# Patient Record
Sex: Female | Born: 1985 | Race: White | Hispanic: No | Marital: Married | Smoking: Never smoker
Health system: Southern US, Community
[De-identification: ages and names within clinical notes are randomized; demographics above are authoritative.]

## PROBLEM LIST (undated history)

## (undated) DIAGNOSIS — Z789 Other specified health status: Secondary | ICD-10-CM

## (undated) HISTORY — PX: WISDOM TOOTH EXTRACTION: SHX21

---
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
## 2010-12-19 LAB — ABO/RH: RH Type: POSITIVE

## 2011-07-24 ENCOUNTER — Encounter (HOSPITAL_COMMUNITY): Payer: Self-pay | Admitting: *Deleted

## 2011-07-24 ENCOUNTER — Inpatient Hospital Stay (HOSPITAL_COMMUNITY)
Admission: AD | Admit: 2011-07-24 | Discharge: 2011-07-24 | Disposition: A | Discharge status: Home / Self Care | Payer: Self-pay | Source: Ambulatory Visit | Attending: Obstetrics and Gynecology | Admitting: Obstetrics and Gynecology

## 2011-07-24 ENCOUNTER — Inpatient Hospital Stay (HOSPITAL_COMMUNITY): Payer: Managed Care, Other (non HMO) | Admitting: Anesthesiology

## 2011-07-24 ENCOUNTER — Encounter (HOSPITAL_COMMUNITY): Payer: Self-pay | Admitting: Anesthesiology

## 2011-07-24 ENCOUNTER — Inpatient Hospital Stay (HOSPITAL_COMMUNITY)
Admission: AD | Admit: 2011-07-24 | Discharge: 2011-07-27 | DRG: 766 | Disposition: A | Discharge status: Home / Self Care | Payer: Managed Care, Other (non HMO) | Source: Ambulatory Visit | Attending: Obstetrics and Gynecology | Admitting: Obstetrics and Gynecology

## 2011-07-24 DIAGNOSIS — O324XX0 Maternal care for high head at term, not applicable or unspecified: Secondary | ICD-10-CM | POA: Diagnosis present

## 2011-07-24 DIAGNOSIS — O479 False labor, unspecified: Secondary | ICD-10-CM | POA: Insufficient documentation

## 2011-07-24 HISTORY — DX: Other specified health status: Z78.9

## 2011-07-24 LAB — CBC
HCT: 41.4 % (ref 36.0–46.0)
Hemoglobin: 14.5 g/dL (ref 12.0–15.0)
MCH: 30.5 pg (ref 26.0–34.0)
MCV: 87.2 fL (ref 78.0–100.0)
RBC: 4.75 MIL/uL (ref 3.87–5.11)

## 2011-07-24 LAB — POCT FERN TEST: Fern Test: POSITIVE

## 2011-07-24 MED ORDER — ONDANSETRON HCL 4 MG/2ML IJ SOLN: 4.0000 mg | Freq: Four times a day (QID) | INTRAMUSCULAR | Status: DC | PRN

## 2011-07-24 MED ORDER — OXYTOCIN BOLUS FROM INFUSION
500.0000 mL | Freq: Once | INTRAVENOUS | Status: DC
  Filled 2011-07-24: qty 500

## 2011-07-24 MED ORDER — FENTANYL 2.5 MCG/ML BUPIVACAINE 1/10 % EPIDURAL INFUSION (WH - ANES): 14.0000 mL/h | INTRAMUSCULAR | Status: DC

## 2011-07-24 MED ORDER — EPHEDRINE 5 MG/ML INJ: 10.0000 mg | INTRAVENOUS | Status: DC | PRN

## 2011-07-24 MED ORDER — LACTATED RINGERS IV SOLN
500.0000 mL | INTRAVENOUS | Status: DC | PRN
Start: 2011-07-24 — End: 2011-07-24

## 2011-07-24 MED ORDER — DIPHENHYDRAMINE HCL 50 MG/ML IJ SOLN: 12.5000 mg | INTRAMUSCULAR | Status: DC | PRN

## 2011-07-24 MED ORDER — LACTATED RINGERS IV SOLN: 500.0000 mL | Freq: Once | INTRAVENOUS | Status: DC

## 2011-07-24 MED ORDER — OXYTOCIN 20 UNITS IN LACTATED RINGERS INFUSION - SIMPLE: 125.0000 mL/h | Freq: Once | INTRAVENOUS | Status: DC

## 2011-07-24 MED ORDER — FLEET ENEMA 7-19 GM/118ML RE ENEM: 1.0000 | ENEMA | RECTAL | Status: DC | PRN

## 2011-07-24 MED ORDER — IBUPROFEN 600 MG PO TABS: 600.0000 mg | ORAL_TABLET | Freq: Four times a day (QID) | ORAL | Status: DC | PRN

## 2011-07-24 MED ORDER — LIDOCAINE HCL (PF) 1 % IJ SOLN: 30.0000 mL | INTRAMUSCULAR | Status: DC | PRN

## 2011-07-24 MED ORDER — EPHEDRINE 5 MG/ML INJ
10.0000 mg | INTRAVENOUS | Status: DC | PRN
  Filled 2011-07-24 (×2): qty 4

## 2011-07-24 MED ORDER — CITRIC ACID-SODIUM CITRATE 334-500 MG/5ML PO SOLN: 30.0000 mL | ORAL | Status: DC | PRN

## 2011-07-24 MED ORDER — FENTANYL 2.5 MCG/ML BUPIVACAINE 1/10 % EPIDURAL INFUSION (WH - ANES)
INTRAMUSCULAR | Status: DC | PRN
  Administered 2011-07-24: 14 mL/h via EPIDURAL

## 2011-07-24 MED ORDER — PHENYLEPHRINE 40 MCG/ML (10ML) SYRINGE FOR IV PUSH (FOR BLOOD PRESSURE SUPPORT)
80.0000 ug | PREFILLED_SYRINGE | INTRAVENOUS | Status: DC | PRN
Start: 2011-07-24 — End: 2011-07-27
  Filled 2011-07-24 (×2): qty 5

## 2011-07-24 MED ORDER — OXYCODONE-ACETAMINOPHEN 5-325 MG PO TABS: 2.0000 | ORAL_TABLET | ORAL | Status: DC | PRN

## 2011-07-24 MED ORDER — ACETAMINOPHEN 325 MG PO TABS: 650.0000 mg | ORAL_TABLET | ORAL | Status: DC | PRN

## 2011-07-24 MED ORDER — LACTATED RINGERS IV SOLN: INTRAVENOUS | Status: DC

## 2011-07-24 MED ORDER — LACTATED RINGERS IV SOLN
500.0000 mL | INTRAVENOUS | Status: DC | PRN
  Administered 2011-07-25: 300 mL via INTRAVENOUS

## 2011-07-24 MED ORDER — PHENYLEPHRINE 40 MCG/ML (10ML) SYRINGE FOR IV PUSH (FOR BLOOD PRESSURE SUPPORT): 80.0000 ug | PREFILLED_SYRINGE | INTRAVENOUS | Status: DC | PRN

## 2011-07-24 MED ORDER — LIDOCAINE HCL 1.5 % IJ SOLN
INTRAMUSCULAR | Status: DC | PRN
  Administered 2011-07-24 (×2): 5 mL via EPIDURAL

## 2011-07-24 MED ORDER — FENTANYL 2.5 MCG/ML BUPIVACAINE 1/10 % EPIDURAL INFUSION (WH - ANES)
14.0000 mL/h | INTRAMUSCULAR | Status: DC
  Administered 2011-07-24: 14 mL/h via EPIDURAL
  Filled 2011-07-24 (×2): qty 60

## 2011-07-24 MED ORDER — LACTATED RINGERS IV SOLN: 500.0000 mL | INTRAVENOUS | Status: DC | PRN

## 2011-07-24 MED ORDER — LACTATED RINGERS IV SOLN
INTRAVENOUS | Status: DC
  Administered 2011-07-24: 20:00:00 via INTRAVENOUS

## 2011-07-24 MED ORDER — PHENYLEPHRINE 40 MCG/ML (10ML) SYRINGE FOR IV PUSH (FOR BLOOD PRESSURE SUPPORT)
80.0000 ug | PREFILLED_SYRINGE | INTRAVENOUS | Status: DC | PRN
  Filled 2011-07-24: qty 5

## 2011-07-24 MED ORDER — TERBUTALINE SULFATE 1 MG/ML IJ SOLN: 0.2500 mg | Freq: Once | INTRAMUSCULAR | Status: AC | PRN

## 2011-07-24 MED ORDER — CITRIC ACID-SODIUM CITRATE 334-500 MG/5ML PO SOLN
30.0000 mL | ORAL | Status: DC | PRN
  Administered 2011-07-25: 30 mL via ORAL
  Filled 2011-07-24: qty 15

## 2011-07-24 MED ORDER — OXYTOCIN 20 UNITS IN LACTATED RINGERS INFUSION - SIMPLE
1.0000 m[IU]/min | INTRAVENOUS | Status: DC
  Filled 2011-07-24: qty 1000

## 2011-07-24 MED ORDER — LIDOCAINE HCL (PF) 1 % IJ SOLN
30.0000 mL | INTRAMUSCULAR | Status: DC | PRN
  Filled 2011-07-24 (×2): qty 30

## 2011-07-24 MED ORDER — LACTATED RINGERS IV SOLN
INTRAVENOUS | Status: DC
  Administered 2011-07-25 (×4): via INTRAVENOUS

## 2011-07-24 NOTE — Progress Notes (Signed)
Dr. Senaida Ores updated on patient status, MVU's, and UC pattern.

## 2011-07-24 NOTE — Progress Notes (Signed)
Dr. Jackelyn Knife notified of pt arrival for labor check.  Notified of reactive fetal strip with ctx every 3-5 min.  Notified of no cervical change after one hour.  Orders received to dc home.

## 2011-07-24 NOTE — Progress Notes (Signed)
Contractions q 5 minutes, denies bleeding or ROM

## 2011-07-24 NOTE — H&P (Signed)
Julie Joseph is a 25 y.o. female G1P0 at 40 weeks (EDD 07/28/11 by [place] at 9 weeks) presents with SROM at 6pm and strong ctx.  Prenatal care uneventful.  Maternal Medical History:  Reason for admission: Reason for admission: rupture of membranes.  Contractions: Onset was 1-2 hours ago.   Frequency: regular.   Perceived severity is strong.      OB History    Grav Para Term Preterm Abortions TAB SAB Ect Mult Living   1              Past Medical History  Diagnosis Date  . No pertinent past medical history    Past Surgical History  Procedure Date  . Wisdom tooth extraction    Social History: quit smoking 12/11, no drugs or ETOH ROS  negative  Dilation: 4 Effacement (%): 80 Station: 0 Exam by:: apannell,rn Blood pressure 118/75, pulse 98, temperature 97.6 F (36.4 C), temperature source Oral, resp. rate 20, height 5\' 5"  (1.651 m), weight 66.225 kg (146 lb), SpO2 100.00%. Maternal Exam:  Uterine Assessment: Contraction frequency is regular.   Abdomen: Patient reports no abdominal tenderness. Fetal presentation: vertex  Introitus: Ferning test: positive.  Nitrazine test: positive. Amniotic fluid character: clear.  Cervix: Cervix evaluated by digital exam.     Physical Exam  Constitutional: She appears well-developed.  Cardiovascular: Normal rate and regular rhythm.   Respiratory: Effort normal and breath sounds normal.  GI: Soft. Bowel sounds are normal.  Genitourinary:       Cervix 90/2-3/-1 per RN on admission  Psychiatric: She has a normal mood and affect.    Prenatal labs: ABO, Rh:  B positive Antibody:  negative Rubella:  Immune RPR:   NR HBsAg:   Neg HIV:   NR GBS:   Neg One hour GTT 89 CF neg GC neg Chlam neg Declined genetics   Assessment/Plan: Pt receiving epidural.  Will follow progress.   Julie Joseph 07/24/2011, 8:01 PM

## 2011-07-24 NOTE — Anesthesia Preprocedure Evaluation (Signed)
Anesthesia Evaluation  Name, MR# and DOB Patient awake  General Assessment Comment  Reviewed: Allergy & Precautions, H&P , NPO status , Patient's Chart, lab work & pertinent test results  Airway Mallampati: I TM Distance: >3 FB Neck ROM: full    Dental No notable dental hx.    Pulmonary  clear to auscultation  pulmonary exam normalPulmonary Exam Normal breath sounds clear to auscultation none    Cardiovascular     Neuro/Psych Negative Neurological ROS  Negative Psych ROS  GI/Hepatic/Renal negative GI ROS  negative Liver ROS  negative Renal ROS        Endo/Other  Negative Endocrine ROS (+)      Abdominal Normal abdominal exam  (+)   Musculoskeletal negative musculoskeletal ROS (+)   Hematology negative hematology ROS (+)   Peds  Reproductive/Obstetrics (+) Pregnancy    Anesthesia Other Findings             Anesthesia Physical Anesthesia Plan  ASA: II  Anesthesia Plan: Epidural   Post-op Pain Management:    Induction:   Airway Management Planned:   Additional Equipment:   Intra-op Plan:   Post-operative Plan:   Informed Consent: I have reviewed the patients History and Physical, chart, labs and discussed the procedure including the risks, benefits and alternatives for the proposed anesthesia with the patient or authorized representative who has indicated his/her understanding and acceptance.     Plan Discussed with:   Anesthesia Plan Comments:         Anesthesia Quick Evaluation

## 2011-07-24 NOTE — Anesthesia Procedure Notes (Signed)
Epidural Patient location during procedure: OB Start time: 07/24/2011 7:28 PM End time: 07/24/2011 7:36 PM Reason for block: procedure for pain  Staffing Anesthesiologist: Sandrea Hughs Performed by: anesthesiologist   Preanesthetic Checklist Completed: patient identified, site marked, surgical consent, pre-op evaluation, timeout performed, IV checked, risks and benefits discussed and monitors and equipment checked  Epidural Patient position: sitting Prep: site prepped and draped and DuraPrep Patient monitoring: continuous pulse ox and blood pressure Approach: midline Injection technique: LOR air  Needle:  Needle type: Tuohy  Needle gauge: 17 G Needle length: 9 cm Needle insertion depth: 5 cm cm Catheter type: closed end flexible Catheter size: 19 Gauge Catheter at skin depth: 10 cm Test dose: negative and 1.5% lidocaine  Assessment Sensory level: T8 Events: blood not aspirated, injection not painful, no injection resistance, negative IV test and no paresthesia

## 2011-07-24 NOTE — Progress Notes (Signed)
Attempted to retake vitals.  BP would not take.

## 2011-07-24 NOTE — Progress Notes (Signed)
Julie Joseph is a 25 y.o. G1P0 at [redacted]w[redacted]d   Subjective: Pt with some pressure  Objective: BP 122/78  Pulse 100  Temp(Src) 98.5 F (36.9 C) (Oral)  Resp 18  Ht 5\' 5"  (1.651 m)  Wt 66.225 kg (146 lb)  BMI 24.30 kg/m2  SpO2 100%      FHT:  FHR: 130 bpm, variability: moderate,  accelerations:  Present,  decelerations:  Absent UC:   regular, every 1-2 minutes SVE:   Dilation: 8.5 Effacement (%): 100 Station: 0 Exam by:: Senaida Ores, MD Feels OP, placed in exaggerated Sims  Labs: Lab Results  Component Value Date   WBC 11.8* 07/24/2011   HGB 14.5 07/24/2011   HCT 41.4 07/24/2011   MCV 87.2 07/24/2011   PLT 156 07/24/2011    Assessment / Plan: Spontaneous labor, progressing normally  Labor: pt had adequate MVU, did not require pitocin Fetal Wellbeing:  Category I Pain Control:  Epidural IRICHARDSON,Pierre Cumpton W 07/24/2011, 10:04 PM

## 2011-07-24 NOTE — Progress Notes (Signed)
Pt presents to mau for labor check.  Ctx started worsening around 10pm.  Has been 1cm on last Thursday.

## 2011-07-24 NOTE — Progress Notes (Signed)
Reviewed FHT from earlier this am.   Reactive NST/neg CST

## 2011-07-24 NOTE — Progress Notes (Signed)
Julie Joseph is a 25 y.o. G1P0 at [redacted]w[redacted]d   Subjective: Pt now comfortable with epidural  Objective: BP 110/80  Pulse 96  Temp(Src) 97.6 F (36.4 C) (Oral)  Resp 20  Ht 5\' 5"  (1.651 m)  Wt 66.225 kg (146 lb)  BMI 24.30 kg/m2  SpO2 100%      FHT:  FHR: 130 bpm, variability: moderate,  accelerations:  Present,  decelerations:  Absent UC:   regular, every 3 minutes SVE:   Dilation: 4.5 Effacement (%): 90 Station: 0 Exam by:: Senaida Ores, MD IUPC placed  Labs: Lab Results  Component Value Date   WBC 11.8* 07/24/2011   HGB 14.5 07/24/2011   HCT 41.4 07/24/2011   MCV 87.2 07/24/2011   PLT 156 07/24/2011    Assessment / Plan: Will augment with pitocin since slow cervical change   Caylei Sperry W 07/24/2011, 8:30 PM

## 2011-07-25 ENCOUNTER — Encounter (HOSPITAL_COMMUNITY)
Admission: AD | Disposition: A | Discharge status: Home / Self Care | Payer: Self-pay | Source: Ambulatory Visit | Attending: Obstetrics and Gynecology

## 2011-07-25 ENCOUNTER — Encounter (HOSPITAL_COMMUNITY): Payer: Self-pay

## 2011-07-25 SURGERY — Surgical Case
Anesthesia: Epidural | Wound class: Clean Contaminated

## 2011-07-25 MED ORDER — FENTANYL CITRATE 0.05 MG/ML IJ SOLN
INTRAMUSCULAR | Status: DC | PRN
  Administered 2011-07-25 (×5): 50 ug via INTRAVENOUS

## 2011-07-25 MED ORDER — MEPERIDINE HCL 25 MG/ML IJ SOLN
6.2500 mg | INTRAMUSCULAR | Status: DC | PRN
  Administered 2011-07-25: 12.5 mg via INTRAVENOUS

## 2011-07-25 MED ORDER — WITCH HAZEL-GLYCERIN EX PADS: 1.0000 "application " | MEDICATED_PAD | CUTANEOUS | Status: DC | PRN

## 2011-07-25 MED ORDER — KETOROLAC TROMETHAMINE 60 MG/2ML IM SOLN
60.0000 mg | Freq: Once | INTRAMUSCULAR | Status: AC | PRN
  Administered 2011-07-25: 60 mg via INTRAMUSCULAR

## 2011-07-25 MED ORDER — CEFAZOLIN SODIUM 1-5 GM-% IV SOLN
INTRAVENOUS | Status: AC
  Filled 2011-07-25: qty 50

## 2011-07-25 MED ORDER — MEPERIDINE HCL 25 MG/ML IJ SOLN
INTRAMUSCULAR | Status: AC
  Filled 2011-07-25: qty 1

## 2011-07-25 MED ORDER — DIPHENHYDRAMINE HCL 25 MG PO CAPS: 25.0000 mg | ORAL_CAPSULE | ORAL | Status: DC | PRN

## 2011-07-25 MED ORDER — MORPHINE SULFATE 0.5 MG/ML IJ SOLN
INTRAMUSCULAR | Status: AC
  Filled 2011-07-25: qty 10

## 2011-07-25 MED ORDER — DIPHENHYDRAMINE HCL 25 MG PO CAPS: 25.0000 mg | ORAL_CAPSULE | Freq: Four times a day (QID) | ORAL | Status: DC | PRN

## 2011-07-25 MED ORDER — IBUPROFEN 600 MG PO TABS
600.0000 mg | ORAL_TABLET | Freq: Four times a day (QID) | ORAL | Status: DC
  Administered 2011-07-25 – 2011-07-27 (×8): 600 mg via ORAL
  Filled 2011-07-25: qty 1

## 2011-07-25 MED ORDER — ZOLPIDEM TARTRATE 5 MG PO TABS: 5.0000 mg | ORAL_TABLET | Freq: Every evening | ORAL | Status: DC | PRN

## 2011-07-25 MED ORDER — HYDROMORPHONE HCL 1 MG/ML IJ SOLN: 0.2500 mg | INTRAMUSCULAR | Status: DC | PRN

## 2011-07-25 MED ORDER — SENNOSIDES-DOCUSATE SODIUM 8.6-50 MG PO TABS
2.0000 | ORAL_TABLET | Freq: Every day | ORAL | Status: DC
  Administered 2011-07-26: 2 via ORAL

## 2011-07-25 MED ORDER — MEPERIDINE HCL 25 MG/ML IJ SOLN
INTRAMUSCULAR | Status: DC | PRN
  Administered 2011-07-25: 6 mg via INTRAVENOUS
  Administered 2011-07-25: 7 mg via INTRAVENOUS
  Administered 2011-07-25 (×2): 6 mg via INTRAVENOUS

## 2011-07-25 MED ORDER — SODIUM CHLORIDE 0.9 % IJ SOLN: 3.0000 mL | INTRAMUSCULAR | Status: DC | PRN

## 2011-07-25 MED ORDER — SCOPOLAMINE 1 MG/3DAYS TD PT72
MEDICATED_PATCH | TRANSDERMAL | Status: AC
  Filled 2011-07-25: qty 1

## 2011-07-25 MED ORDER — OXYTOCIN 20 UNITS IN LACTATED RINGERS INFUSION - SIMPLE: 125.0000 mL/h | INTRAVENOUS | Status: AC

## 2011-07-25 MED ORDER — IBUPROFEN 600 MG PO TABS
600.0000 mg | ORAL_TABLET | Freq: Four times a day (QID) | ORAL | Status: DC | PRN
  Filled 2011-07-25 (×7): qty 1

## 2011-07-25 MED ORDER — PRENATAL PLUS 27-1 MG PO TABS
1.0000 | ORAL_TABLET | Freq: Every day | ORAL | Status: DC
  Administered 2011-07-25 – 2011-07-27 (×3): 1 via ORAL
  Filled 2011-07-25 (×2): qty 1

## 2011-07-25 MED ORDER — LACTATED RINGERS IV SOLN
INTRAVENOUS | Status: DC | PRN
  Administered 2011-07-25: 02:00:00 via INTRAVENOUS

## 2011-07-25 MED ORDER — TETANUS-DIPHTH-ACELL PERTUSSIS 5-2.5-18.5 LF-MCG/0.5 IM SUSP
0.5000 mL | Freq: Once | INTRAMUSCULAR | Status: AC
  Administered 2011-07-26: 0.5 mL via INTRAMUSCULAR
  Filled 2011-07-25: qty 0.5

## 2011-07-25 MED ORDER — OXYTOCIN 20 UNITS IN LACTATED RINGERS INFUSION - SIMPLE
INTRAVENOUS | Status: DC | PRN
  Administered 2011-07-25 (×2): 20 [IU] via INTRAVENOUS

## 2011-07-25 MED ORDER — SIMETHICONE 80 MG PO CHEW
80.0000 mg | CHEWABLE_TABLET | Freq: Three times a day (TID) | ORAL | Status: DC
  Administered 2011-07-25 – 2011-07-26 (×7): 80 mg via ORAL

## 2011-07-25 MED ORDER — FENTANYL CITRATE 0.05 MG/ML IJ SOLN
INTRAMUSCULAR | Status: AC
  Filled 2011-07-25: qty 5

## 2011-07-25 MED ORDER — ONDANSETRON HCL 4 MG/2ML IJ SOLN
INTRAMUSCULAR | Status: DC | PRN
  Administered 2011-07-25: 4 mg via INTRAVENOUS

## 2011-07-25 MED ORDER — LIDOCAINE-EPINEPHRINE 2 %-1:100000 IJ SOLN
INTRAMUSCULAR | Status: DC | PRN
  Administered 2011-07-25 (×4): 5 mL via INTRADERMAL

## 2011-07-25 MED ORDER — NALBUPHINE SYRINGE 5 MG/0.5 ML
5.0000 mg | INJECTION | INTRAMUSCULAR | Status: DC | PRN
  Filled 2011-07-25: qty 1

## 2011-07-25 MED ORDER — SIMETHICONE 80 MG PO CHEW: 80.0000 mg | CHEWABLE_TABLET | ORAL | Status: DC | PRN

## 2011-07-25 MED ORDER — LACTATED RINGERS IV SOLN
INTRAVENOUS | Status: DC
  Administered 2011-07-25: 13:00:00 via INTRAVENOUS

## 2011-07-25 MED ORDER — ONDANSETRON HCL 4 MG/2ML IJ SOLN
INTRAMUSCULAR | Status: AC
  Filled 2011-07-25: qty 2

## 2011-07-25 MED ORDER — KETOROLAC TROMETHAMINE 30 MG/ML IJ SOLN: 15.0000 mg | Freq: Once | INTRAMUSCULAR | Status: DC | PRN

## 2011-07-25 MED ORDER — ONDANSETRON HCL 4 MG PO TABS: 4.0000 mg | ORAL_TABLET | ORAL | Status: DC | PRN

## 2011-07-25 MED ORDER — KETOROLAC TROMETHAMINE 60 MG/2ML IM SOLN
INTRAMUSCULAR | Status: AC
  Filled 2011-07-25: qty 2

## 2011-07-25 MED ORDER — OXYCODONE-ACETAMINOPHEN 5-325 MG PO TABS: 1.0000 | ORAL_TABLET | ORAL | Status: DC | PRN

## 2011-07-25 MED ORDER — MENTHOL 3 MG MT LOZG: 1.0000 | LOZENGE | OROMUCOSAL | Status: DC | PRN

## 2011-07-25 MED ORDER — MORPHINE SULFATE (PF) 0.5 MG/ML IJ SOLN
INTRAMUSCULAR | Status: DC | PRN
  Administered 2011-07-25: 4 mg via EPIDURAL

## 2011-07-25 MED ORDER — ONDANSETRON HCL 4 MG/2ML IJ SOLN: 4.0000 mg | INTRAMUSCULAR | Status: DC | PRN

## 2011-07-25 MED ORDER — LANOLIN HYDROUS EX OINT: 1.0000 "application " | TOPICAL_OINTMENT | CUTANEOUS | Status: DC | PRN

## 2011-07-25 MED ORDER — KETOROLAC TROMETHAMINE 30 MG/ML IJ SOLN: 30.0000 mg | Freq: Four times a day (QID) | INTRAMUSCULAR | Status: AC | PRN

## 2011-07-25 MED ORDER — DIBUCAINE 1 % RE OINT: 1.0000 "application " | TOPICAL_OINTMENT | RECTAL | Status: DC | PRN

## 2011-07-25 MED ORDER — DIPHENHYDRAMINE HCL 50 MG/ML IJ SOLN: 25.0000 mg | INTRAMUSCULAR | Status: DC | PRN

## 2011-07-25 MED ORDER — ONDANSETRON HCL 4 MG/2ML IJ SOLN: 4.0000 mg | Freq: Once | INTRAMUSCULAR | Status: DC | PRN

## 2011-07-25 MED ORDER — SODIUM BICARBONATE 8.4 % IV SOLN
INTRAVENOUS | Status: AC
  Filled 2011-07-25: qty 50

## 2011-07-25 MED ORDER — DIPHENHYDRAMINE HCL 50 MG/ML IJ SOLN: 12.5000 mg | INTRAMUSCULAR | Status: DC | PRN

## 2011-07-25 MED ORDER — ONDANSETRON HCL 4 MG/2ML IJ SOLN: 4.0000 mg | Freq: Three times a day (TID) | INTRAMUSCULAR | Status: DC | PRN

## 2011-07-25 MED ORDER — LIDOCAINE-EPINEPHRINE (PF) 2 %-1:200000 IJ SOLN
INTRAMUSCULAR | Status: AC
  Filled 2011-07-25: qty 20

## 2011-07-25 MED ORDER — NALOXONE HCL 0.4 MG/ML IJ SOLN: 0.4000 mg | INTRAMUSCULAR | Status: DC | PRN

## 2011-07-25 MED ORDER — MORPHINE SULFATE (PF) 0.5 MG/ML IJ SOLN
INTRAMUSCULAR | Status: DC | PRN
  Administered 2011-07-25: 1 mg via EPIDURAL

## 2011-07-25 MED ORDER — SCOPOLAMINE 1 MG/3DAYS TD PT72
1.0000 | MEDICATED_PATCH | Freq: Once | TRANSDERMAL | Status: DC
  Administered 2011-07-25: 1.5 mg via TRANSDERMAL

## 2011-07-25 MED ORDER — SODIUM CHLORIDE 0.9 % IV SOLN
1.0000 ug/kg/h | INTRAVENOUS | Status: DC | PRN
  Filled 2011-07-25: qty 2.5

## 2011-07-25 MED ORDER — MEPERIDINE HCL 25 MG/ML IJ SOLN: 6.2500 mg | INTRAMUSCULAR | Status: DC | PRN

## 2011-07-25 MED ORDER — CEFAZOLIN SODIUM 1-5 GM-% IV SOLN
INTRAVENOUS | Status: DC | PRN
  Administered 2011-07-25: 1 g via INTRAVENOUS

## 2011-07-25 MED ORDER — OXYTOCIN 10 UNIT/ML IJ SOLN
INTRAMUSCULAR | Status: AC
  Filled 2011-07-25: qty 4

## 2011-07-25 SURGICAL SUPPLY — 29 items
CHLORAPREP W/TINT 26ML (MISCELLANEOUS) ×2 IMPLANT
CLOTH BEACON ORANGE TIMEOUT ST (SAFETY) ×2 IMPLANT
CONTAINER PREFILL 10% NBF 15ML (MISCELLANEOUS) IMPLANT
ELECT REM PT RETURN 9FT ADLT (ELECTROSURGICAL) ×2
ELECTRODE REM PT RTRN 9FT ADLT (ELECTROSURGICAL) ×1 IMPLANT
EXTRACTOR VACUUM KIWI (MISCELLANEOUS) IMPLANT
EXTRACTOR VACUUM M CUP 4 TUBE (SUCTIONS) IMPLANT
GLOVE BIO SURGEON STRL SZ 6.5 (GLOVE) ×4 IMPLANT
GLOVE BIO SURGEON STRL SZ8 (GLOVE) ×2 IMPLANT
GOWN PREVENTION PLUS LG XLONG (DISPOSABLE) ×4 IMPLANT
KIT ABG SYR 3ML LUER SLIP (SYRINGE) IMPLANT
NDL HYPO 25X5/8 SAFETYGLIDE (NEEDLE) ×1 IMPLANT
NEEDLE HYPO 25X5/8 SAFETYGLIDE (NEEDLE) ×2 IMPLANT
NS IRRIG 1000ML POUR BTL (IV SOLUTION) ×2 IMPLANT
PACK C SECTION WH (CUSTOM PROCEDURE TRAY) ×2 IMPLANT
RTRCTR C-SECT PINK 25CM LRG (MISCELLANEOUS) ×2 IMPLANT
SLEEVE SCD COMPRESS KNEE MED (MISCELLANEOUS) IMPLANT
STAPLER VISISTAT 35W (STAPLE) IMPLANT
SUT CHROMIC 1 CTX 36 (SUTURE) ×4 IMPLANT
SUT PLAIN 0 NONE (SUTURE) IMPLANT
SUT PLAIN 2 0 XLH (SUTURE) IMPLANT
SUT VIC AB 0 CT1 27 (SUTURE) ×4
SUT VIC AB 0 CT1 27XBRD ANBCTR (SUTURE) ×2 IMPLANT
SUT VIC AB 2-0 CT1 27 (SUTURE)
SUT VIC AB 2-0 CT1 TAPERPNT 27 (SUTURE) IMPLANT
SUT VIC AB 4-0 KS 27 (SUTURE) IMPLANT
TOWEL OR 17X24 6PK STRL BLUE (TOWEL DISPOSABLE) ×4 IMPLANT
TRAY FOLEY CATH 14FR (SET/KITS/TRAYS/PACK) ×2 IMPLANT
WATER STERILE IRR 1000ML POUR (IV SOLUTION) ×2 IMPLANT

## 2011-07-25 NOTE — Progress Notes (Signed)
  Pt reached complete dilation and began pushing at 1100pm.  She pushed well but only advanced vertex to a +2 station over 1 1/2 hour and became fatigued with little progress over the last 15 minutes of pushing. The FHR was overall reassuring with scalp stim and good variability but began to have more prolonged variable decels.  Pt and husband counseled and desired a trial of vacuum assisted delivery after risks addressed.  Foley removed and kiwi vacuum applied to +2 station op presentation for two pulls in the green zone. Each time the suction was lost with no real advancement of the vertex.  The vacuum really just slid off rathe r than a true pop off.  One further attempt was made with the soft cup vacuum, but again no real progress made with one pull and poor suction maintained.  D/w pt and husband need to proceed with C/S.  We discussed risks and benefits and possible difficulty delivering a deeply wedged head.  Pt agrees to proceed.

## 2011-07-25 NOTE — Anesthesia Postprocedure Evaluation (Signed)
Anesthesia Post Note  Patient: Julie Joseph  Procedure(s) Performed:  CESAREAN SECTION - Low Transverse  Anesthesia type: Epidural  Patient location: Mother/Baby  Post pain: Pain level controlled  Post assessment: Post-op Vital signs reviewed  Last Vitals:  Filed Vitals:   07/25/11 0330  BP: 116/75  Pulse: 96  Temp:   Resp: 21    Post vital signs: Reviewed  Level of consciousness: awake  Complications: No apparent anesthesia complications

## 2011-07-25 NOTE — Anesthesia Postprocedure Evaluation (Signed)
  Anesthesia Post-op Note  Patient: Julie Joseph  Procedure(s) Performed:  CESAREAN SECTION - Low Transverse  Patient Location: PACU and Mother/Baby  Anesthesia Type: Epidural  Level of Consciousness: awake, alert  and oriented  Airway and Oxygen Therapy: Patient Spontanous Breathing  Post-op Pain: none  Post-op Assessment: Patient's Cardiovascular Status Stable and Respiratory Function Stable  Post-op Vital Signs: stable  Complications: No apparent anesthesia complications

## 2011-07-25 NOTE — Op Note (Signed)
Operative note  Preop diagnosis Term pregnancy at 40 weeks Arrest of descent at +2 station Failed vacuum assisted delivery  Postop diagnosis Same  Procedure Primary low transverse C-section with 2 layer closure of uterus  Surgeon Dr. Huel Cote  Anesthesia Epidural  Findings there is a viable female infant deeply wedged vertex in the pelvis in the OP presentation. There was a nuchal cord x1 reduced. Apgars were 8 and 9 and weight was 7 lbs. 2 oz. Normal uterus ovaries and tubes were noted  Fluids estimated blood loss 400 cc Urine output 50 cc slightly blood-tinged urine IV fluid 2100 cc LR  Specimen placenta sent to L&D  Procedure The patient was brought to the operating room after informed consent was obtained. She was then prepped and draped in the normal sterile fashion in the dorsal supine position with a leftward tilt. Fetal heart tones were auscultated prior to incision and stable. An appropriate time out was performed and the epidural found to be adequate by Allis clamp test. A Pfannenstiel skin incision was then made with the scalpel and carried through to underlying layer of fascia by sharp dissection and Bovie cautery. The fascia was nicked in the midline and the incision was extended laterally with Mayo scissors. The inferior aspect was grasped with Coker clamps elevated and dissected off the underlying rectus muscles. In a similar fashion the superior aspect was dissected off the rectus muscles. These were separated in the midline and the peritoneal cavity entered sharply. Peritoneal incision was then extended with careful attention to avoid both bowel bladder the Alexis self-retaining wound retractor was then placed within the incision. The lower uterine segment was exposed and incised in a transverse fashion. The cavity itself was entered bluntly. Presenting was the infant's shoulder and the head was found to be deeply wedge to the pelvis. This was able to be elevated  with no undue trauma and the infant delivered with the nose and mouth bulb suctioned. The remainder of the body delivered without difficulty and the cord was clamped and cut with the infant handed to the waiting pediatricians. The placenta was then expressed spontaneously and the uterus cleared of all clots and debris with a moist lap sponge. The incision was grasped at each angle with a ring forcep and a slight extension noted on the right angle. The uterine incision was then closed the first a running locked layer of 1-0 chromic. The small extension was closed with a separate suture of 1-0 chromic. A second layer of 1-0 chromic was also placed in an imbricating fashion. At the conclusion of the closure all was hemostatic. The tubes and ovaries were inspected and found to be normal and all instruments and sponges were removed from the abdomen. The rectus muscles and peritoneum were then closed in several interrupted mattress sutures of 2-0 Vicryl. The fascia was closed with 0 Vicryl in a running fashion. The skin was closed with 3-0 Vicryl in a subcuticular stitch on a Keith needle. Again all instrument counts were correct and the patient was taken to the recovery room in good condition. The baby went to the newborn nursery.

## 2011-07-25 NOTE — Progress Notes (Signed)
Dr. Senaida Ores at bedside to remain with patient through pushing.

## 2011-07-25 NOTE — Transfer of Care (Signed)
Immediate Anesthesia Transfer of Care Note  Patient: Julie Joseph  Procedure(s) Performed:  CESAREAN SECTION - Low Transverse  Patient Location: PACU  Anesthesia Type: Epidural  Level of Consciousness: awake, alert , oriented and patient cooperative  Airway & Oxygen Therapy: Patient Spontanous Breathing  Post-op Assessment: Report given to PACU RN  Post vital signs: Reviewed and stable  Complications: No apparent anesthesia complications

## 2011-07-25 NOTE — Progress Notes (Signed)
Subjective: Postpartum Day0: Cesarean Delivery Patient reports tolerating PO and pain controlled  Objective: Vital signs in last 24 hours: Temp:  [97.3 F (36.3 C)-98.9 F (37.2 C)] 98.9 F (37.2 C) (09/07 0722) Pulse Rate:  [73-128] 73  (09/07 0722) Resp:  [16-24] 20  (09/07 0722) BP: (86-151)/(54-95) 96/57 mmHg (09/07 0722) SpO2:  [83 %-100 %] 97 % (09/07 0722) Weight:  [66.225 kg (146 lb)] 146 lb (66.225 kg) (09/06 1706)  Physical Exam:  General: alert Lochia: appropriate Uterine Fundus: firm Incision: C/D/I  Basename 07/24/11 1835  HGB 14.5  HCT 41.4    Assessment/Plan: Status post Cesarean section. Doing well postoperatively.  Continue current care.  Oliver Pila 07/25/2011, 9:05 AM

## 2011-07-25 NOTE — Progress Notes (Signed)
Dr. Senaida Ores discusses the plan of care with the patient after a failed vacuum delivery. Patient is informed of the risks and benefits of a c-section delivery. Patient wants to go through with this plan of care.

## 2011-07-25 NOTE — Progress Notes (Signed)
Encounter addended by: Truitt Leep, CRNA on: 07/25/2011 10:21 AM<BR>     Documentation filed: Notes Section

## 2011-07-25 NOTE — Brief Op Note (Signed)
07/24/2011 - 07/25/2011  2:10 AM  PATIENT:  Julie Joseph  25 y.o. female  PRE-OPERATIVE DIAGNOSIS:  Intrauterine Pregnancy At Term 40 weeks                                                       Laboring; Failure To Descend                                                       Failed Vaccum Delivery  POST-OPERATIVE DIAGNOSIS:  Same  Procedure Primary Low transverse C-Section; 2 layer closure of uterus  SURGEON:  Surgeon(s): Oliver Pila  ANESTHESIA:   epidural  OR FLUID I/O:  Total I/O In: 2400 [I.V.:2400] Out: 425 [Urine:25; Blood:400] Urine blood tinged on entering OR, clearing after delivery  Findings Viable female, deeply wedged OP in pelvis, nuchal x 1 Apgars 8,9 Weight 7#2oz  Normal uterus ovaries and tubes   BLOOD ADMINISTERED:none  SPECIMEN:  Placenta  DISPOSITION OF SPECIMEN:  L&D  COUNTS:  YES   PATIENT DISPOSITION:  PACU - hemodynamically stable.

## 2011-07-25 NOTE — Progress Notes (Signed)
Julie Joseph informs patient about options to further progression with pushing. Vacuum delivery discussed and patient informed of procedure, risks, and benefits. Patient wants to try a vacuum delivery.

## 2011-07-26 LAB — CBC
Hemoglobin: 10.3 g/dL — ABNORMAL LOW (ref 12.0–15.0)
MCHC: 32.8 g/dL (ref 30.0–36.0)
WBC: 14.2 10*3/uL — ABNORMAL HIGH (ref 4.0–10.5)

## 2011-07-26 NOTE — Progress Notes (Signed)
#   2 afebrile no problems Tolerating a diet, voiding well, passing flatus and ambulating well.

## 2011-07-27 MED ORDER — OXYCODONE-ACETAMINOPHEN 5-325 MG PO TABS: 1.0000 | ORAL_TABLET | Freq: Four times a day (QID) | ORAL | Status: AC | PRN

## 2011-07-27 MED ORDER — IBUPROFEN 600 MG PO TABS: 600.0000 mg | ORAL_TABLET | Freq: Four times a day (QID) | ORAL | Status: AC | PRN

## 2011-07-27 NOTE — Discharge Summary (Signed)
[name], Julie Joseph              ACCOUNT NO.:  [id number]  MEDICAL RECORD NO.:  [id number]  LOCATION:  9121                          FACILITY:  [facility]  PHYSICIAN:  Malachi Pro. Ambrose Mantle, M.D. DATE OF BIRTH:  [birth year]  DATE OF ADMISSION:  07/24/2011 DATE OF DISCHARGE:  07/27/2011                              DISCHARGE SUMMARY   HISTORY:  A 25 year old female, para 0, gravida 1 at 19 weeks' gestation with [facility] of July 28, 2011, presented with spontaneous rupture of membranes and strong contractions.  Blood group and type B positive, negative antibody, rubella immune, RPR nonreactive, hepatitis B surface antigen negative, HIV negative, GBS negative, 1-hour Glucola 89, cystic fibrosis, GC and Chlamydia negative.  She declined genetic screening. After admission to the hospital, she received an epidural.  She was 4-5 cm dilated at 8:30 p.m.  At 10:04 p.m., she was 8.5 cm dilated.  She reached complete dilatation and began pushing at 11 p.m.  She pushed well, but only advanced vertex to a +2 station about 1-1/2 hours and became fatigued with little progress.  Fetal heart rate was overall reassuring with scalp stimulation in good variability, but she began to have more variable decelerations.  Vacuum was applied by Dr. Senaida Ores with OP presentation for 2 pulls in the green zone.  Each time, the suction was lost with no real advancement of vertex.  One further attempt was made with a soft cup vacuum, but again no real progress with one pull and push, suction maintained.  Dr. Senaida Ores proceeded to do a C-section.  At the time of the C-section, the head was wedged in the pelvis in the OP presentation with no undue trauma.  The infant's head was lifted out of the pelvis.  The procedure was uncomplicated.  Baby was 7 pounds 2 ounces, female, Apgars of 8 and 9 at one and five minutes.  Postpartum, the patient did quite well and was discharged on the second postoperative day.  Her initial  hemoglobin was 14.5, hematocrit 41.4, white count 11,800, platelet count of 156,000. Followup hemoglobin was 10.3, hematocrit 31.4.  The patient is ambulating well without difficulty, tolerating a regular diet, and is ready for discharge.  FINAL DIAGNOSES:  Intrauterine pregnancy at 40 weeks, delivered vertex, OP by C-section, relative cephalopelvic disproportion.  OPERATION:  Trial of vacuum delivery, unsuccessful; low-transverse cervical C-section.  FINAL CONDITION:  Improved.  INSTRUCTIONS:  Our regular discharge instruction booklet.  She is given a prescription for Percocet 5/325 and Motrin 600 mg, 30 of each, one every 6 hours as needed for pain and is advised to return in 10 days to see Dr. Senaida Ores for followup incision check.     Malachi Pro. Ambrose Mantle, M.D.    TFH/MEDQ  D:  07/27/2011  T:  07/27/2011  Job:  [id number]

## 2011-07-31 ENCOUNTER — Inpatient Hospital Stay (HOSPITAL_COMMUNITY): Admission: RE | Admit: 2011-07-31 | Payer: Self-pay | Source: Ambulatory Visit

## 2011-07-31 ENCOUNTER — Encounter (HOSPITAL_COMMUNITY): Payer: Self-pay | Admitting: Obstetrics and Gynecology

## 2014-09-18 ENCOUNTER — Encounter (HOSPITAL_COMMUNITY): Payer: Self-pay | Admitting: Obstetrics and Gynecology

## 2019-06-05 ENCOUNTER — Encounter (HOSPITAL_COMMUNITY): Payer: Self-pay | Admitting: Emergency Medicine

## 2019-06-05 ENCOUNTER — Other Ambulatory Visit: Payer: Self-pay

## 2019-06-05 ENCOUNTER — Ambulatory Visit (HOSPITAL_COMMUNITY)
Admission: EM | Admit: 2019-06-05 | Discharge: 2019-06-05 | Disposition: A | Discharge status: Home / Self Care | Payer: 59 | Attending: Family Medicine | Admitting: Family Medicine

## 2019-06-05 DIAGNOSIS — W268XXA Contact with other sharp object(s), not elsewhere classified, initial encounter: Secondary | ICD-10-CM | POA: Diagnosis not present

## 2019-06-05 DIAGNOSIS — Z23 Encounter for immunization: Secondary | ICD-10-CM | POA: Diagnosis not present

## 2019-06-05 DIAGNOSIS — S81811A Laceration without foreign body, right lower leg, initial encounter: Secondary | ICD-10-CM

## 2019-06-05 MED ORDER — TETANUS-DIPHTH-ACELL PERTUSSIS 5-2.5-18.5 LF-MCG/0.5 IM SUSP
INTRAMUSCULAR | Status: AC
  Filled 2019-06-05: qty 0.5

## 2019-06-05 MED ORDER — TETANUS-DIPHTH-ACELL PERTUSSIS 5-2.5-18.5 LF-MCG/0.5 IM SUSP
0.5000 mL | Freq: Once | INTRAMUSCULAR | Status: AC
  Administered 2019-06-05: 0.5 mL via INTRAMUSCULAR

## 2019-06-05 NOTE — ED Provider Notes (Signed)
[facility]    CSN: [id number] Arrival date & time: 06/05/19  1346     History   Chief Complaint Chief Complaint  Patient presents with  . Laceration    HPI Julie Joseph is a 33 y.o. female presenting for acute concern of right shin laceration.  Patient states that her husband was out doing yard work with a [employer] operational officerhatchet when he grazed across her shin.  Happened 1 hour ago.  Hemostasis achieved prior to arrival.  Patient endorsing pain/tenderness.  Denies pain with ambulating, distal extremity numbness/weakness.  Patient is unsure of tetanus status.    Past Medical History:  Diagnosis Date  . No pertinent past medical history     There are no active problems to display for this patient.   Past Surgical History:  Procedure Laterality Date  . CESAREAN SECTION  07/25/2011   Procedure: CESAREAN SECTION;  Surgeon: Oliver PilaKathy W Richardson;  Location: [facility];  Service: Gynecology;  Laterality: N/A;  Low Transverse  . WISDOM TOOTH EXTRACTION      OB History    Gravida  1   Para  1   Term  1   Preterm      AB      Living  1     SAB      TAB      Ectopic      Multiple      Live Births  1            Home Medications    Prior to Admission medications   Medication Sig Start Date End Date Taking? Authorizing Provider  prenatal vitamin w/FE, FA (PRENATAL 1 + 1) 27-1 MG TABS Take 1 tablet by mouth daily.      [provider]    Family History History reviewed. No pertinent family history.  Social History Social History   Tobacco Use  . Smoking status: Never Smoker  Substance Use Topics  . Alcohol use: No  . Drug use: No     Allergies   Latex   Review of Systems Review of Systems  Constitutional: Negative for fatigue and fever.  HENT: Negative for voice change.   Respiratory: Negative for cough and shortness of breath.   Cardiovascular: Negative for chest pain and palpitations.  Musculoskeletal: Negative for arthralgias and  myalgias.  Skin: Positive for wound. Negative for rash.  Neurological: Negative for syncope and headaches.     Physical Exam Triage Vital Signs ED Triage Vitals  Enc Vitals Group     BP 06/05/19 1418 103/72     Pulse Rate 06/05/19 1418 94     Resp 06/05/19 1418 16     Temp 06/05/19 1418 99.4 F (37.4 C)     Temp Source 06/05/19 1418 Oral     SpO2 06/05/19 1418 99 %     Weight --      Height --      Head Circumference --      Peak Flow --      Pain Score 06/05/19 1417 5     Pain Loc --      Pain Edu? --      Excl. in GC? --    No data found.  Updated Vital Signs BP 103/72 (BP Location: Right Arm)   Pulse 94   Temp 99.4 F (37.4 C) (Oral)   Resp 16   SpO2 99%   Visual Acuity Right Eye Distance:   Left Eye Distance:   Bilateral  Distance:    Right Eye Near:   Left Eye Near:    Bilateral Near:     Physical Exam Constitutional:      General: She is not in acute distress. HENT:     Head: Normocephalic and atraumatic.  Eyes:     General: No scleral icterus.    Pupils: Pupils are equal, round, and reactive to light.  Cardiovascular:     Rate and Rhythm: Normal rate.  Pulmonary:     Effort: Pulmonary effort is normal.  Musculoskeletal: Normal range of motion.        General: Swelling, tenderness and signs of injury present.     Comments: 5/5 strength of affected knee, ankle joints.  Skin:    Coloration: Skin is not jaundiced or pale.     Comments: 3 cm transverse laceration on right shin.  Hemostasis achieved prior to arrival.  No foreign body identified.  NVI  Neurological:     Mental Status: She is alert and oriented to person, place, and time.      UC Treatments / Results  Labs (all labs ordered are listed, but only abnormal results are displayed) Labs Reviewed - No data to display  EKG   Radiology No results found.  Procedures Laceration Repair  Date/Time: 06/05/2019 6:43 PM Performed by: Shea EvansHall-Potvin, Brittany, PA-C Authorized by: Eustace MooreNelson,  Yvonne Sue, MD   Consent:    Consent obtained:  Verbal   Consent given by:  Patient   Risks discussed:  Infection, need for additional repair, pain, poor cosmetic result and poor wound healing   Alternatives discussed:  No treatment and delayed treatment Universal protocol:    Patient identity confirmed:  Verbally with patient Anesthesia (see MAR for exact dosages):    Anesthesia method:  Local infiltration   Local anesthetic:  Lidocaine 2% w/o epi Laceration details:    Location:  Leg   Leg location:  R lower leg   Length (cm):  3   Depth (mm):  5 Repair type:    Repair type:  Simple Pre-procedure details:    Preparation:  Patient was prepped and draped in usual sterile fashion Exploration:    Hemostasis achieved with:  Direct pressure   Wound exploration: wound explored through full range of motion   Treatment:    Area cleansed with:  Saline   Amount of cleaning:  Standard   Irrigation solution:  Sterile saline   Irrigation method:  Pressure wash Skin repair:    Repair method:  Sutures   Suture size:  4-0   Suture material:  Prolene   Suture technique:  Simple interrupted and horizontal mattress   Number of sutures:  3 Approximation:    Approximation:  Close Post-procedure details:    Dressing:  Non-adherent dressing   Patient tolerance of procedure:  Tolerated well, no immediate complications   (including critical care time)  Medications Ordered in UC Medications  Tdap (BOOSTRIX) injection 0.5 mL (0.5 mLs Intramuscular Given 06/05/19 1428)  Tdap (BOOSTRIX) 5-2.5-18.5 LF-MCG/0.5 injection (has no administration in time range)    Initial Impression / Assessment and Plan / UC Course  I have reviewed the triage vital signs and the nursing notes.  Pertinent labs & imaging results that were available during my care of the patient were reviewed by me and considered in my medical decision making (see chart for details).     1.  Laceration of right lower extremity 3  sutures placed and tetanus booster administered in office which patient  tolerated well.  Advised patient to ice, elevate and use OTC ibuprofen Tylenol for pain relief.  Return precautions discussed, patient verbalized understanding and is agreeable to plan. Final Clinical Impressions(s) / UC Diagnoses   Final diagnoses:  Laceration of right lower extremity, initial encounter     Discharge Instructions     Keep area clean and dry. Return if you develop worsening pain, swelling, redness, purulent discharge, malodor, lower foot pain, swelling.    ED Prescriptions    None     Controlled Substance Prescriptions [place] Controlled Substance Registry consulted? Not Applicable   Quincy Sheehan, [place] 06/05/19 1846

## 2019-06-05 NOTE — Discharge Instructions (Signed)
Keep area clean and dry. Return if you develop worsening pain, swelling, redness, purulent discharge, malodor, lower foot pain, swelling.

## 2019-06-05 NOTE — ED Triage Notes (Signed)
Pt with laceration to right shin from hatchet 1 hour ago; bleeding controlled

## 2019-07-12 ENCOUNTER — Other Ambulatory Visit: Payer: Self-pay | Admitting: Gynecology

## 2019-07-12 DIAGNOSIS — N644 Mastodynia: Secondary | ICD-10-CM

## 2019-07-20 ENCOUNTER — Other Ambulatory Visit: Payer: 59

## 2019-07-21 ENCOUNTER — Other Ambulatory Visit: Payer: Self-pay

## 2019-07-21 ENCOUNTER — Ambulatory Visit
Admission: RE | Admit: 2019-07-21 | Discharge: 2019-07-21 | Disposition: A | Discharge status: Home / Self Care | Payer: 59 | Source: Ambulatory Visit | Attending: Gynecology | Admitting: Gynecology

## 2019-07-21 DIAGNOSIS — N644 Mastodynia: Secondary | ICD-10-CM

## 2021-07-15 ENCOUNTER — Other Ambulatory Visit: Payer: Self-pay

## 2021-07-15 ENCOUNTER — Emergency Department (HOSPITAL_BASED_OUTPATIENT_CLINIC_OR_DEPARTMENT_OTHER): Payer: 59 | Admitting: Radiology

## 2021-07-15 ENCOUNTER — Emergency Department (HOSPITAL_BASED_OUTPATIENT_CLINIC_OR_DEPARTMENT_OTHER)
Admission: EM | Admit: 2021-07-15 | Discharge: 2021-07-15 | Disposition: A | Discharge status: Home / Self Care | Payer: 59 | Attending: Emergency Medicine | Admitting: Emergency Medicine

## 2021-07-15 ENCOUNTER — Encounter (HOSPITAL_BASED_OUTPATIENT_CLINIC_OR_DEPARTMENT_OTHER): Payer: Self-pay | Admitting: Obstetrics and Gynecology

## 2021-07-15 DIAGNOSIS — Z91040 Latex allergy status: Secondary | ICD-10-CM | POA: Insufficient documentation

## 2021-07-15 DIAGNOSIS — R1012 Left upper quadrant pain: Secondary | ICD-10-CM

## 2021-07-15 LAB — CBC
HCT: 37.3 % (ref 36.0–46.0)
Hemoglobin: 12.7 g/dL (ref 12.0–15.0)
MCH: 29.2 pg (ref 26.0–34.0)
MCHC: 34 g/dL (ref 30.0–36.0)
MCV: 85.7 fL (ref 80.0–100.0)
Platelets: 235 10*3/uL (ref 150–400)
RBC: 4.35 MIL/uL (ref 3.87–5.11)
RDW: 12.5 % (ref 11.5–15.5)
WBC: 6 10*3/uL (ref 4.0–10.5)
nRBC: 0 % (ref 0.0–0.2)

## 2021-07-15 LAB — URINALYSIS, ROUTINE W REFLEX MICROSCOPIC
Bilirubin Urine: NEGATIVE
Glucose, UA: NEGATIVE mg/dL
Hgb urine dipstick: NEGATIVE
Ketones, ur: NEGATIVE mg/dL
Nitrite: NEGATIVE
Protein, ur: NEGATIVE mg/dL
Specific Gravity, Urine: 1.016 (ref 1.005–1.030)
pH: 6.5 (ref 5.0–8.0)

## 2021-07-15 LAB — COMPREHENSIVE METABOLIC PANEL
ALT: 10 U/L (ref 0–44)
AST: 14 U/L — ABNORMAL LOW (ref 15–41)
Albumin: 4 g/dL (ref 3.5–5.0)
Alkaline Phosphatase: 46 U/L (ref 38–126)
Anion gap: 9 (ref 5–15)
BUN: 10 mg/dL (ref 6–20)
CO2: 24 mmol/L (ref 22–32)
Calcium: 9.4 mg/dL (ref 8.9–10.3)
Chloride: 104 mmol/L (ref 98–111)
Creatinine, Ser: 0.71 mg/dL (ref 0.44–1.00)
GFR, Estimated: >60 mL/min (ref >60)
Glucose, Bld: 95 mg/dL (ref 70–99)
Potassium: 3.5 mmol/L (ref 3.5–5.1)
Sodium: 137 mmol/L (ref 135–145)
Total Bilirubin: 0.4 mg/dL (ref 0.3–1.2)
Total Protein: 7.6 g/dL (ref 6.5–8.1)

## 2021-07-15 LAB — PREGNANCY, URINE: Preg Test, Ur: NEGATIVE

## 2021-07-15 LAB — TROPONIN I (HIGH SENSITIVITY): Troponin I (High Sensitivity): <2 ng/L (ref <18)

## 2021-07-15 LAB — LIPASE, BLOOD: Lipase: 19 U/L (ref 11–51)

## 2021-07-15 NOTE — Discharge Instructions (Addendum)
Please follow-up with your primary doctor.  Come back to ER for chest pain or difficulty breathing, worsening abdominal pain, vomiting or other new concerning symptom.

## 2021-07-15 NOTE — ED Triage Notes (Signed)
Patient reports to the ER for LUQ abdominal pain. Patient feel panicky today but reports the pain almost feels like it is shooting into her chest. Patient reports she started Macrobid yesterday for UTI.

## 2021-07-15 NOTE — ED Provider Notes (Signed)
[facility] EMERGENCY DEPT Provider Note   CSN: [id number] Arrival date & time: 07/15/21  1109     History Chief Complaint  Patient presents with   Abdominal Pain    Julie Joseph is a 35 y.o. female.  Presents to ER with concern for upper quadrant pain.  Patient reports that pain has been ongoing for last couple days, described as aching sensation but also shooting sensation when he goes up into her chest.  Currently not having any ongoing pain.  This morning felt anxious and somewhat short of breath.  No shortness of breath at present.  No cough or difficulty in breathing.  Also states that she was recently diagnosed with UTI and started on Macrobid.  Denies ongoing dysuria, hematuria, discharge, fever.  No nausea vomiting or diarrhea.  HPI     Past Medical History:  Diagnosis Date   No pertinent past medical history     There are no problems to display for this patient.   Past Surgical History:  Procedure Laterality Date   CESAREAN SECTION  07/25/2011   Procedure: CESAREAN SECTION;  Surgeon: Oliver Pila;  Location: [facility];  Service: Gynecology;  Laterality: N/A;  Low Transverse   WISDOM TOOTH EXTRACTION       OB History     Gravida  1   Para  1   Term  1   Preterm      AB      Living  1      SAB      IAB      Ectopic      Multiple      Live Births  1           Family History  Problem Relation Age of Onset   Breast cancer Maternal Grandmother     Social History   Tobacco Use   Smoking status: Never    Passive exposure: Never   Smokeless tobacco: Never  Vaping Use   Vaping Use: Never used  Substance Use Topics   Alcohol use: No   Drug use: No    Home Medications Prior to Admission medications   Medication Sig Start Date End Date Taking? Authorizing Provider  prenatal vitamin w/FE, FA (PRENATAL 1 + 1) 27-1 MG TABS Take 1 tablet by mouth daily.      [provider]    Allergies    Latex  Review  of Systems   Review of Systems  Constitutional:  Negative for chills and fever.  HENT:  Negative for ear pain and sore throat.   Eyes:  Negative for pain and visual disturbance.  Respiratory:  Negative for cough and shortness of breath.   Cardiovascular:  Positive for chest pain. Negative for palpitations.  Gastrointestinal:  Positive for abdominal pain. Negative for vomiting.  Genitourinary:  Negative for dysuria and hematuria.  Musculoskeletal:  Negative for arthralgias and back pain.  Skin:  Negative for color change and rash.  Neurological:  Negative for seizures and syncope.  All other systems reviewed and are negative.  Physical Exam Updated Vital Signs BP 106/69 (BP Location: Right Arm)   Pulse 74   Temp 98.4 F (36.9 C)   Resp 18   LMP  (Exact Date)   SpO2 100%   Physical Exam Vitals and nursing note reviewed.  Constitutional:      General: She is not in acute distress.    Appearance: She is well-developed.  HENT:     Head:  Normocephalic and atraumatic.  Eyes:     Conjunctiva/sclera: Conjunctivae normal.  Cardiovascular:     Rate and Rhythm: Normal rate and regular rhythm.     Heart sounds: No murmur heard. Pulmonary:     Effort: Pulmonary effort is normal. No respiratory distress.     Breath sounds: Normal breath sounds.  Abdominal:     Palpations: Abdomen is soft.     Tenderness: There is no abdominal tenderness.  Musculoskeletal:     Cervical back: Neck supple.  Skin:    General: Skin is warm and dry.  Neurological:     Mental Status: She is alert.    ED Results / Procedures / Treatments   Labs (all labs ordered are listed, but only abnormal results are displayed) Labs Reviewed  COMPREHENSIVE METABOLIC PANEL - Abnormal; Notable for the following components:      Result Value   AST 14 (*)    All other components within normal limits  URINALYSIS, ROUTINE W REFLEX MICROSCOPIC - Abnormal; Notable for the following components:   Leukocytes,Ua SMALL (*)     Bacteria, UA FEW (*)    Non Squamous Epithelial 0-5 (*)    All other components within normal limits  LIPASE, BLOOD  CBC  PREGNANCY, URINE  TROPONIN I (HIGH SENSITIVITY)    EKG EKG Interpretation  Date/Time:  Monday July 15 2021 13:08:44 EDT Ventricular Rate:  84 PR Interval:  160 QRS Duration: 86 QT Interval:  379 QTC Calculation: 448 R Axis:   76 Text Interpretation: Sinus rhythm Confirmed by Marianna Fuss ([id number]) on 07/15/2021 1:43:20 PM  Radiology DG Chest 2 View  Result Date: 07/15/2021 CLINICAL DATA:  Chest pain EXAM: CHEST - 2 VIEW COMPARISON:  None. FINDINGS: The cardiomediastinal silhouette is normal. The lungs clear, with no focal consolidation or pulmonary edema. There is no pleural effusion or pneumothorax. There is no acute osseous abnormality. IMPRESSION: No radiographic evidence of acute cardiopulmonary process. Electronically Signed   By: Lesia Hausen M.D.   On: 07/15/2021 13:25    Procedures Procedures   Medications Ordered in ED Medications - No data to display  ED Course  I have reviewed the triage vital signs and the nursing notes.  Pertinent labs & imaging results that were available during my care of the patient were reviewed by me and considered in my medical decision making (see chart for details).    MDM Rules/Calculators/A&P                           35 year old presents to ER with concern for left upper quadrant abdominal pain.  Also endorsed pain that would intermittently radiate into her chest.  On exam patient appears well in no acute distress.  Her abdomen is soft and nontender throughout careful palpation.  Basic labs are stable.  Normal LFTs, no leukocytosis.  Regarding the chest pain, check EKG, troponin, EKG within normal limits, troponin within normal limits, doubt ACS.  Chest x-ray unremarkable.  Given symptomatology, suspect either MSK versus GERD.  Given reassuring work-up, believe patient can be discharged and managed in the  outpatient setting.  Recommend she follow-up with primary doctor.  Discharged home.  After the discussed management above, the patient was determined to be safe for discharge.  The patient was in agreement with this plan and all questions regarding their care were answered.  ED return precautions were discussed and the patient will return to the ED with any significant worsening of  condition.  Final Clinical Impression(s) / ED Diagnoses Final diagnoses:  LUQ pain    Rx / DC Orders ED Discharge Orders     None        Milagros Loll, MD 07/16/21 [phone]

## 2021-07-15 NOTE — ED Notes (Signed)
Patient verbalizes understanding of discharge instructions. Opportunity for questioning and answers were provided. Patient discharged from ED.  °

## 2022-04-09 ENCOUNTER — Other Ambulatory Visit: Payer: Self-pay | Admitting: Obstetrics and Gynecology

## 2022-04-09 DIAGNOSIS — N6320 Unspecified lump in the left breast, unspecified quadrant: Secondary | ICD-10-CM

## 2022-04-17 ENCOUNTER — Other Ambulatory Visit: Payer: 59

## 2022-04-28 ENCOUNTER — Other Ambulatory Visit: Payer: 59

## 2022-05-07 ENCOUNTER — Ambulatory Visit
Admission: RE | Admit: 2022-05-07 | Discharge: 2022-05-07 | Disposition: A | Discharge status: Home / Self Care | Payer: 59 | Source: Ambulatory Visit | Attending: Obstetrics and Gynecology | Admitting: Obstetrics and Gynecology

## 2022-05-07 DIAGNOSIS — N6320 Unspecified lump in the left breast, unspecified quadrant: Secondary | ICD-10-CM

## 2023-09-25 IMAGING — MG DIGITAL DIAGNOSTIC BILAT W/ TOMO W/ CAD
8 of 14 series · 8 of 40 positions shown · non-contrast
Comparison: Previous exam(s).

CLINICAL DATA: 35-year-old female with focal pain and thickening in
the UPPER OUTER LEFT breast.

EXAM:
DIGITAL DIAGNOSTIC BILATERAL MAMMOGRAM WITH TOMOSYNTHESIS AND CAD;
ULTRASOUND LEFT BREAST LIMITED
TECHNIQUE: Bilateral digital diagnostic mammography and breast tomosynthesis
was performed. The images were evaluated with computer-aided
detection.; Targeted ultrasound examination of the left breast was
performed.

[L CC synth-2D]
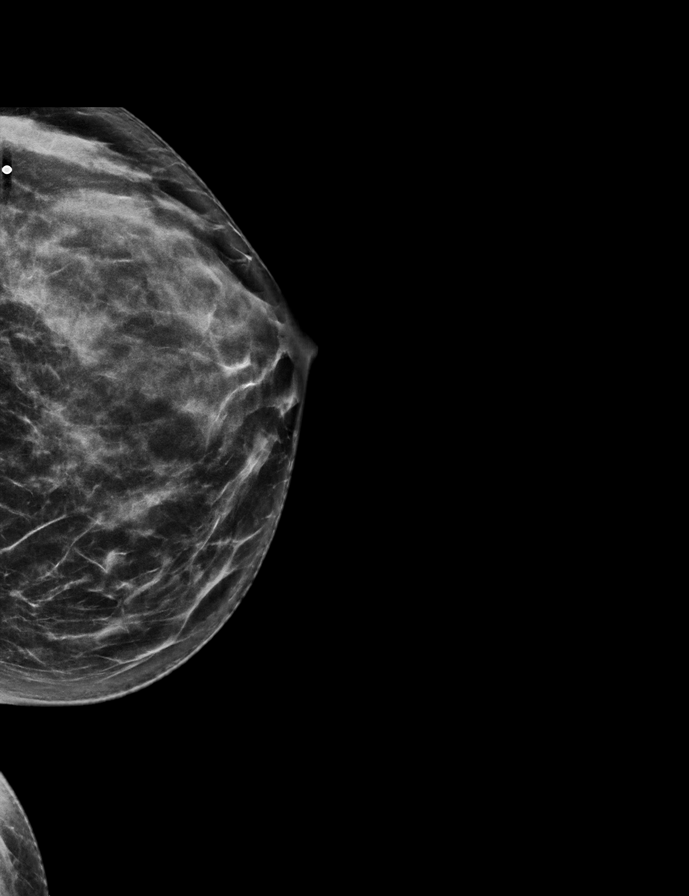

[R MLO synth-2D]
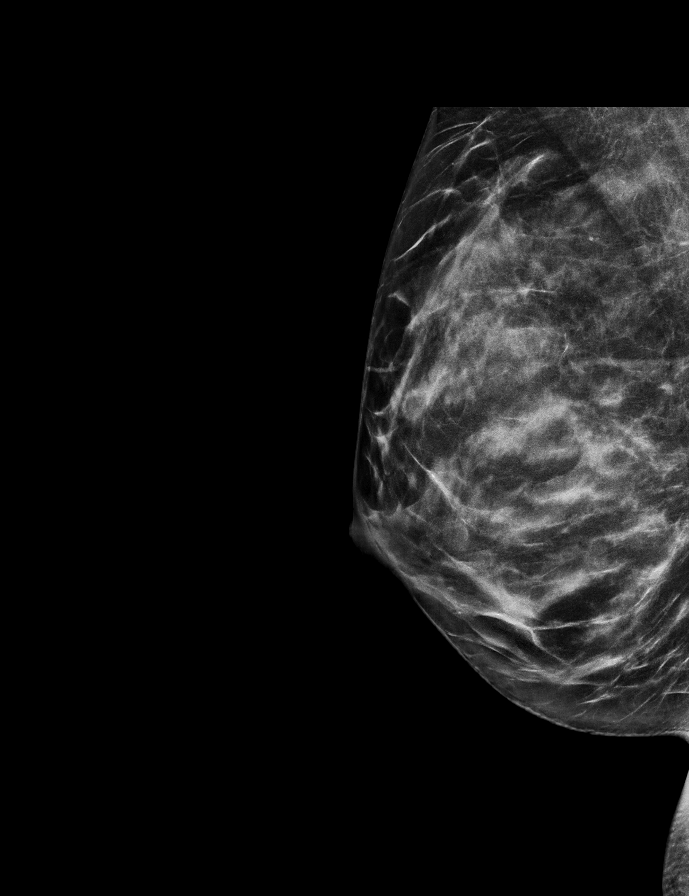

[R XCCL synth-2D]
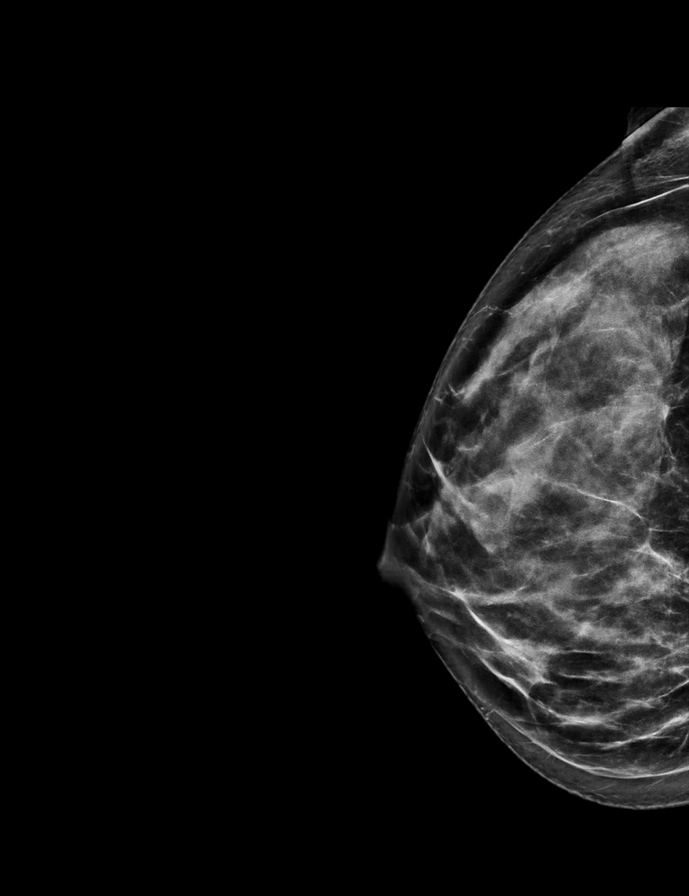

[L XCCL synth-2D]
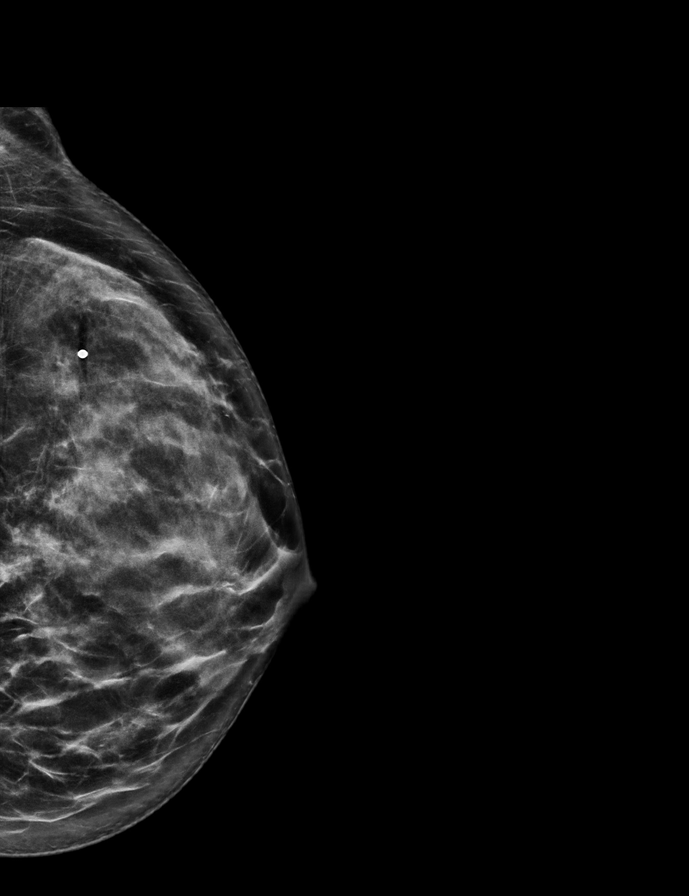

[L MLO synth-2D]
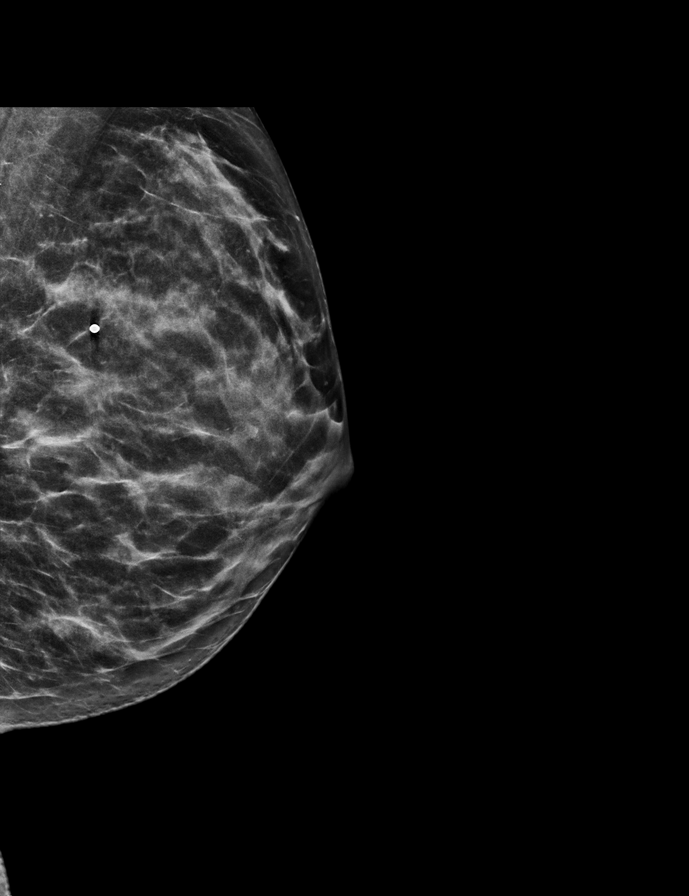

[R CC synth-2D]
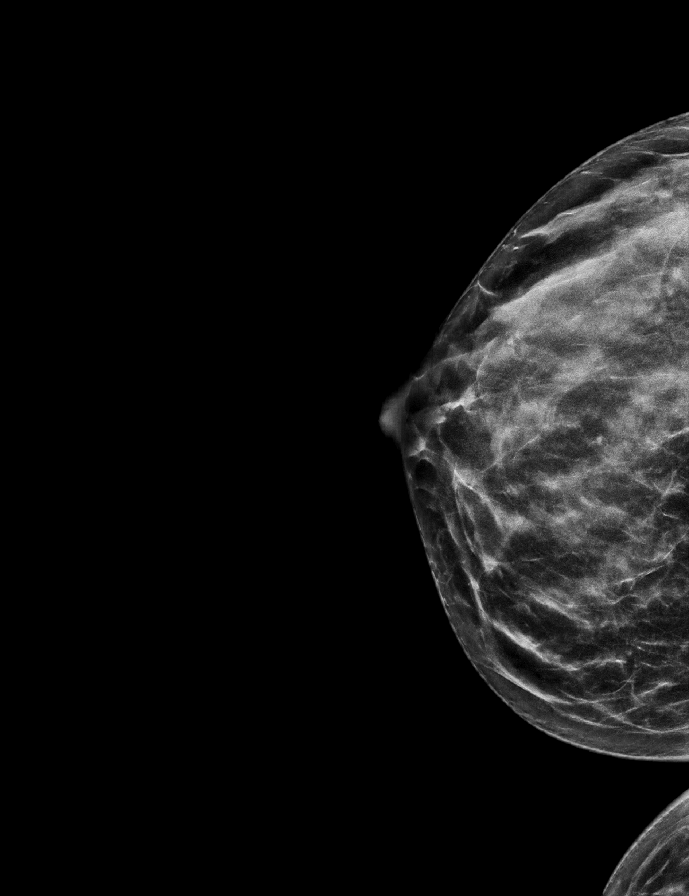

[L TAN synth-2D]
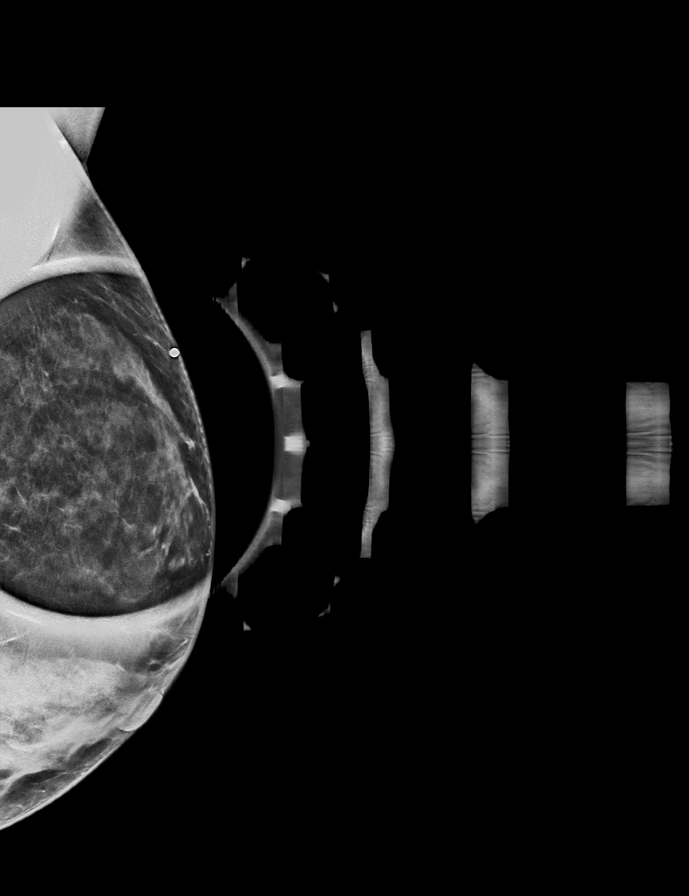

[R XCCL tomo · tomo slice 33/66.0]
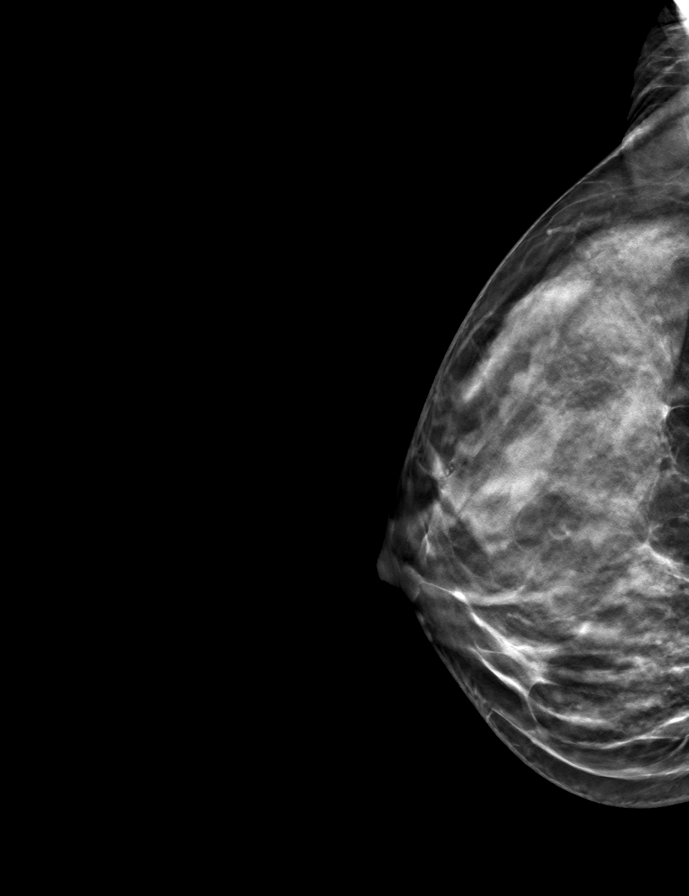

[8 of 40 positions shown; findings below may reference images not displayed]

ACR Breast Density Category c: The breast tissue is heterogeneously
dense, which may obscure small masses.
FINDINGS: Full field views of both breasts and spot compression view of the
LEFT breast demonstrate no suspicious mass, distortion or worrisome
calcifications.

On physical exam, mild thickening in the OUTER and UPPER OUTER LEFT
breast identified without discrete palpable mass.

Targeted ultrasound is performed, showing no sonographic
abnormalities within the OUTER or UPPER OUTER LEFT breast, in the
area of patient concern. Normal dense fibroglandular tissue
throughout this area noted.
IMPRESSION: 1. No mammographic or sonographic abnormalities within the OUTER or
UPPER OUTER LEFT breast, in the area of patient concern. Normal
dense fibroglandular tissue throughout this area noted.
2. No mammographic evidence of malignancy within either breast.

RECOMMENDATION:
Bilateral screening mammogram in 1 year.

Consider clinical follow-up as indicated. Any further workup should
be based on clinical grounds.

I have discussed the findings, causes and remedies of breast pain
and recommendations with the patient. If applicable, a reminder
letter will be sent to the patient regarding the next appointment.

BI-RADS CATEGORY  1: Negative.
